# Patient Record
Sex: Male | Born: 1955 | Race: White | Hispanic: No | Marital: Married | Smoking: Never smoker
Health system: Southern US, Community
[De-identification: ages and names within clinical notes are randomized; demographics above are authoritative.]

## PROBLEM LIST (undated history)

## (undated) DIAGNOSIS — I10 Essential (primary) hypertension: Secondary | ICD-10-CM

## (undated) HISTORY — PX: EYE SURGERY: SHX253

## (undated) HISTORY — PX: SHOULDER ARTHROSCOPY: SHX128

## (undated) HISTORY — PX: APPENDECTOMY: SHX54

## (undated) HISTORY — PX: CORONARY ANGIOPLASTY: SHX604

---

## 1998-07-14 ENCOUNTER — Ambulatory Visit (HOSPITAL_BASED_OUTPATIENT_CLINIC_OR_DEPARTMENT_OTHER): Admission: RE | Admit: 1998-07-14 | Discharge: 1998-07-14 | Payer: Self-pay | Admitting: Orthopedic Surgery

## 1999-07-18 ENCOUNTER — Ambulatory Visit (HOSPITAL_COMMUNITY): Admission: RE | Admit: 1999-07-18 | Discharge: 1999-07-18 | Payer: Self-pay | Admitting: *Deleted

## 1999-07-18 ENCOUNTER — Encounter: Payer: Self-pay | Admitting: *Deleted

## 2000-05-31 ENCOUNTER — Emergency Department (HOSPITAL_COMMUNITY): Admission: EM | Admit: 2000-05-31 | Discharge: 2000-05-31 | Payer: Self-pay

## 2000-12-17 ENCOUNTER — Encounter: Payer: Self-pay | Admitting: Emergency Medicine

## 2000-12-17 ENCOUNTER — Emergency Department (HOSPITAL_COMMUNITY): Admission: EM | Admit: 2000-12-17 | Discharge: 2000-12-17 | Payer: Self-pay | Admitting: Emergency Medicine

## 2006-08-19 ENCOUNTER — Emergency Department (HOSPITAL_COMMUNITY): Admission: EM | Admit: 2006-08-19 | Discharge: 2006-08-19 | Payer: Self-pay | Admitting: Emergency Medicine

## 2008-10-01 IMAGING — CR DG LUMBAR SPINE COMPLETE 4+V
5 series · 5 of 5 positions shown · non-contrast
Comparison: None.

CLINICAL DATA: Back and abdominal pain. Rectal bleeding.
 ACUTE ABDOMINAL SERIES - 3 VIEW:

[t l-spine a.p.]
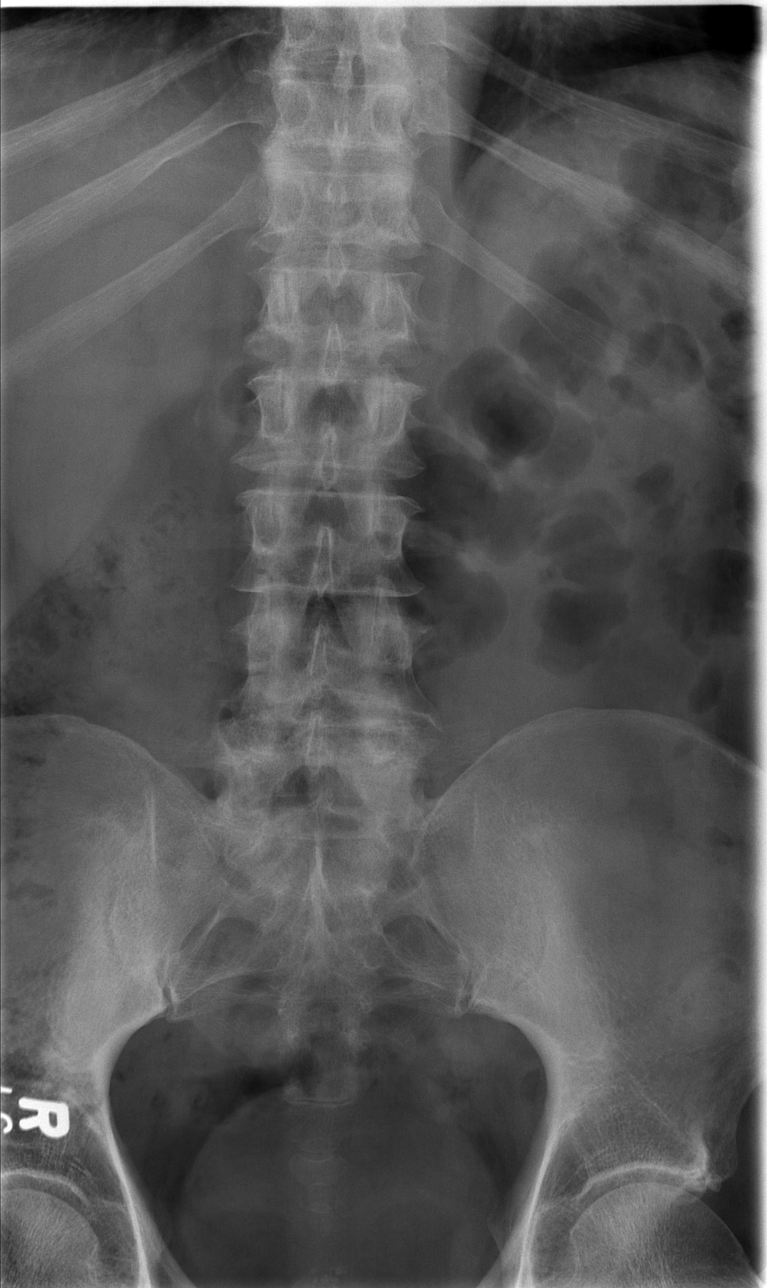

[t l-spine oblique exposure (1 of 2)]
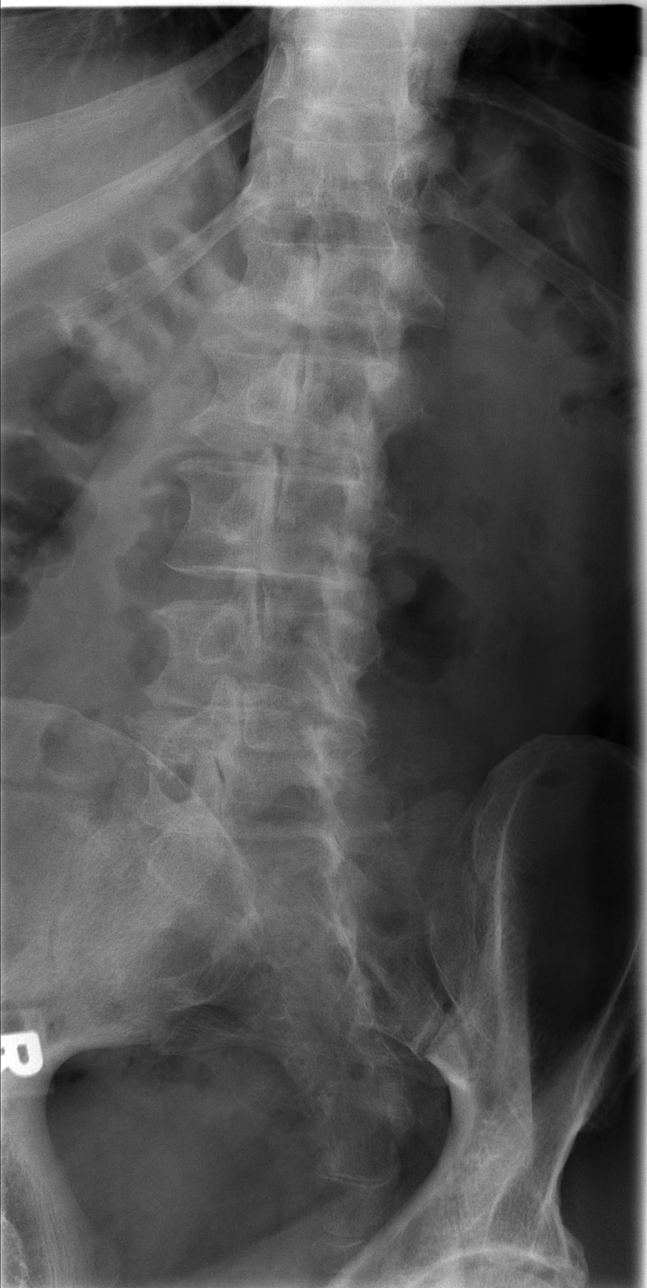

[t l-spine oblique exposure (2 of 2)]
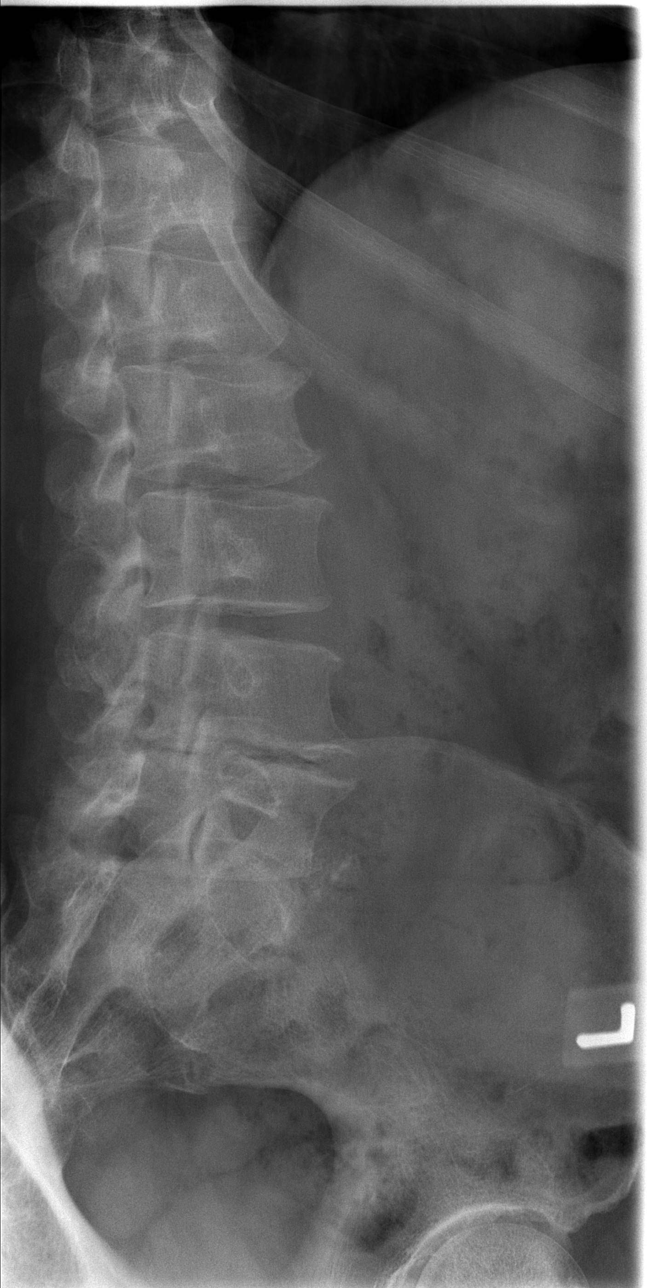

[t l-spine lat]
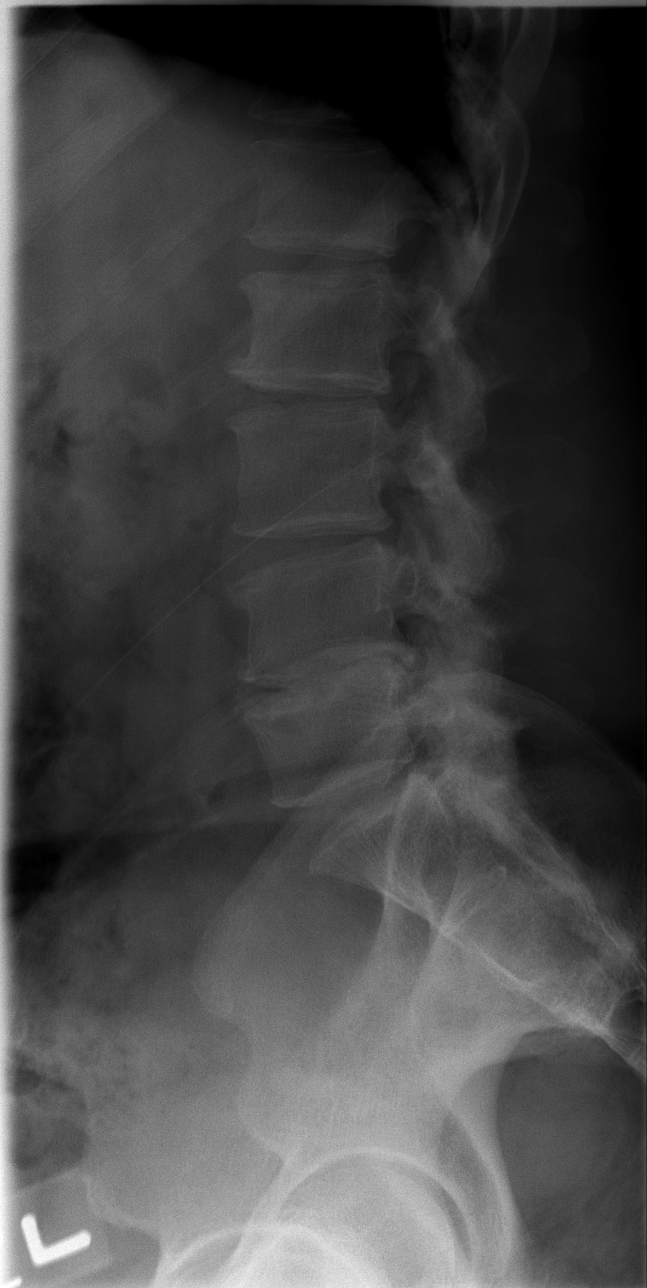

[t l-spine l5-s1 spot]
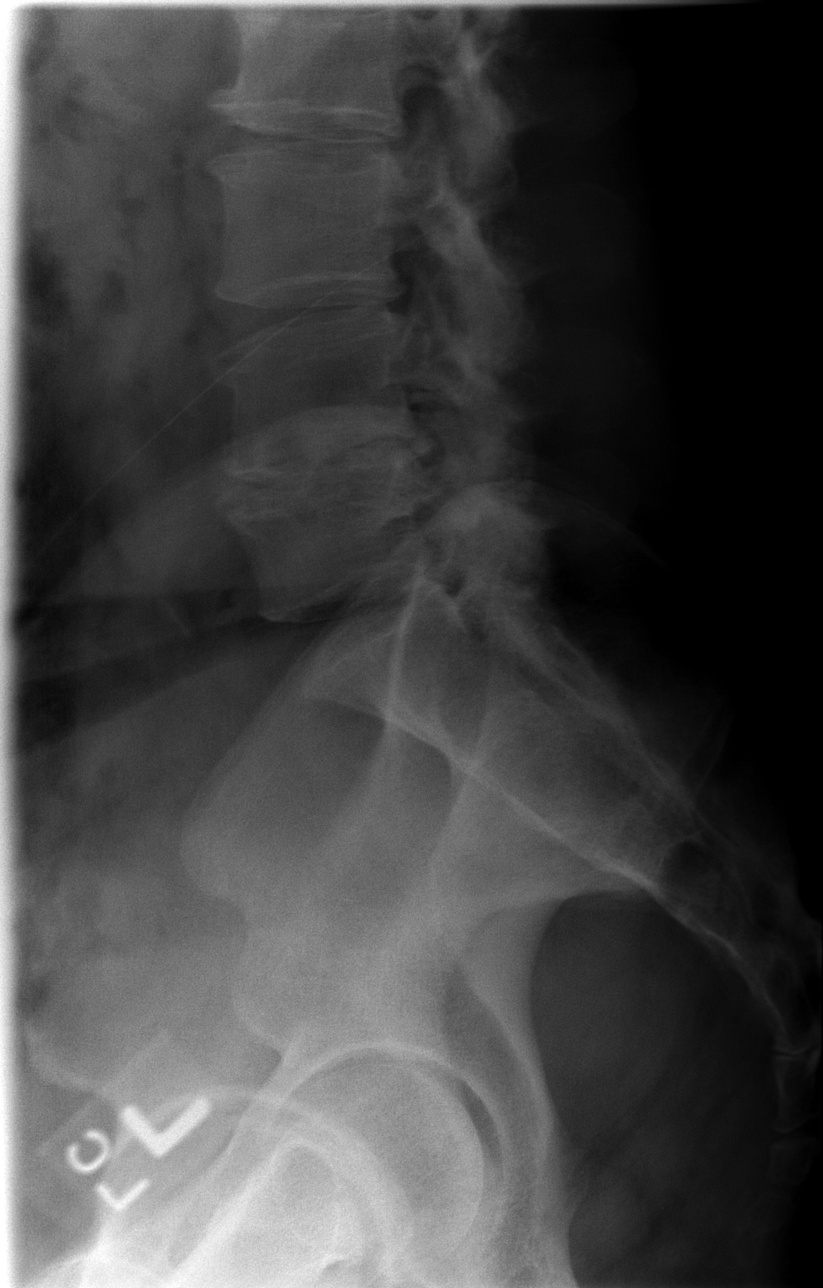

[5 of 5 positions shown; findings below may reference images not displayed]

FINDINGS: Single view of chest demonstrates clear lungs and normal heart size. No effusion or focal bony abnormality. 
 Two views of the abdomen show a normal bowel gas pattern and no unexpected abdominal calcifications. No free intraperitoneal air.
IMPRESSION: No acute finding chest or abdomen. 
 THORACIC SPINE - 2 VIEW:
FINDINGS: Vertebral body height and alignment are maintained. There is some degenerative disease scattered through the spine.
IMPRESSION: No acute finding with relatively mild appearing degenerative disease noted.
 LUMBAR SPINE - 4 VIEW:
FINDINGS: Vertebral body height and alignment are maintained. The patient has multilevel degenerative disc disease with loss of disc space height at all levels of the lumbar spine, worst at L4-5. Extensive facet arthropathy is noted.
IMPRESSION: No acute finding with marked degenerative change of the lumbar spine appearing worst at L4-5.

## 2011-10-18 ENCOUNTER — Emergency Department (HOSPITAL_BASED_OUTPATIENT_CLINIC_OR_DEPARTMENT_OTHER)
Admission: EM | Admit: 2011-10-18 | Discharge: 2011-10-18 | Disposition: A | Discharge status: Home / Self Care | Payer: BC Managed Care – PPO | Attending: Emergency Medicine | Admitting: Emergency Medicine

## 2011-10-18 ENCOUNTER — Encounter (HOSPITAL_BASED_OUTPATIENT_CLINIC_OR_DEPARTMENT_OTHER): Payer: Self-pay | Admitting: *Deleted

## 2011-10-18 DIAGNOSIS — I10 Essential (primary) hypertension: Secondary | ICD-10-CM | POA: Insufficient documentation

## 2011-10-18 DIAGNOSIS — Y92838 Other recreation area as the place of occurrence of the external cause: Secondary | ICD-10-CM | POA: Insufficient documentation

## 2011-10-18 DIAGNOSIS — S61209A Unspecified open wound of unspecified finger without damage to nail, initial encounter: Secondary | ICD-10-CM | POA: Insufficient documentation

## 2011-10-18 DIAGNOSIS — W230XXA Caught, crushed, jammed, or pinched between moving objects, initial encounter: Secondary | ICD-10-CM | POA: Insufficient documentation

## 2011-10-18 DIAGNOSIS — S61219A Laceration without foreign body of unspecified finger without damage to nail, initial encounter: Secondary | ICD-10-CM

## 2011-10-18 DIAGNOSIS — Y9389 Activity, other specified: Secondary | ICD-10-CM | POA: Insufficient documentation

## 2011-10-18 DIAGNOSIS — Y9239 Other specified sports and athletic area as the place of occurrence of the external cause: Secondary | ICD-10-CM | POA: Insufficient documentation

## 2011-10-18 DIAGNOSIS — Y998 Other external cause status: Secondary | ICD-10-CM | POA: Insufficient documentation

## 2011-10-18 HISTORY — DX: Essential (primary) hypertension: I10

## 2011-10-18 MED ORDER — TETANUS-DIPHTH-ACELL PERTUSSIS 5-2.5-18.5 LF-MCG/0.5 IM SUSP
0.5000 mL | Freq: Once | INTRAMUSCULAR | Status: AC
  Administered 2011-10-18: 0.5 mL via INTRAMUSCULAR

## 2011-10-18 NOTE — ED Provider Notes (Signed)
History     CSN: [id number]  Arrival date & time 10/18/11  0041   First MD Initiated Contact with Patient 10/18/11 0050      Chief Complaint  Patient presents with  . Laceration    (Consider location/radiation/quality/duration/timing/severity/associated sxs/prior treatment) HPI Patient is a 56 year old male who was at the gym this evening lifting weights when he sustained a laceration over the PIP joint of his right third finger when placing the weight back and the rack. Patient did not have a crush injury. Patient is able to flex and extend without difficulty. He reports that there was a significant amount of initial bleeding but is no longer actively bleeding here. A 1-1/2 cm laceration is noted with only a half millimeter of gapping incomplete flexion. Patient says his pain level is a two out of three and a slight throbbing. He's not a diabetic and has no history of immunodeficiency. Patient denies any other injuries. He does not know when his last tetanus shot was. There are no other associated or modifying factors. Pain is worse with flexion and better with relaxation. Past Medical History  Diagnosis Date  . Hypertension     Past Surgical History  Procedure Date  . Eye surgery     History reviewed. No pertinent family history.  History  Substance Use Topics  . Smoking status: Never Smoker   . Smokeless tobacco: Not on file  . Alcohol Use: No      Review of Systems  Constitutional: Negative.   Musculoskeletal: Negative.   Skin: Positive for wound.  Neurological: Negative.   All other systems reviewed and are negative.    Allergies  Review of patient's allergies indicates not on file.  Home Medications   Current Outpatient Rx  Name Route Sig Dispense Refill  . LISINOPRIL 10 MG PO TABS Oral Take 10 mg by mouth daily.    Marland Kitchen SIMVASTATIN 40 MG PO TABS Oral Take 40 mg by mouth every evening.      BP 124/77  Pulse 102  Temp 98.2 F (36.8 C) (Oral)  Resp 20   Ht 5' 4.75" (1.645 m)  Wt 230 lb (104.327 kg)  BMI 38.57 kg/m2  SpO2 95%  Physical Exam  Nursing note and vitals reviewed. GEN: Well-developed, well-nourished male in no distress HEENT: Atraumatic, normocephalic.  EYES: PERRLA BL, no scleral icterus. NECK: Trachea midline, no meningismus CV: regular rate and rhythm.  PULM: No respiratory distress.   Neuro: cranial nerves grossly 2-12 intact, no abnormalities of strength or sensation, A and O x 3 MSK: Patient moves all 4 extremities symmetrically, no deformity, edema, or injury noted Skin: Patient was a one and a half centimeter horizontally oriented laceration noted crossing the PIP of the right flexion the patient this only 1/2 mm of gapping with no active bleeding. Psych: no abnormality of mood   ED Course  Procedures (including critical care time) LACERATION REPAIR Performed by: Cyndra Numbers Authorized by: Cyndra Numbers Consent: Verbal consent obtained. Risks and benefits: risks, benefits and alternatives were discussed Consent given by: patient Patient identity confirmed: provided demographic data Prepped and Draped in normal sterile fashion Wound explored  Laceration Location: Right third finger   Laceration Length: 1.5 cm  No Foreign Bodies seen or palpated  Anesthesia: none Irrigation method: soaking with iodine Amount of cleaning: standard  Skin closure: steristrips  Number of sutures: 2 strips   Patient tolerance: Patient tolerated the procedure well with no immediate complications. Labs Reviewed - No data to  display No results found.   1. Laceration of finger of right hand       MDM  Patient was evaluated by myself. Patient was not having any active bleeding at his laceration site. I did not have suspicion for bony injury and patient described injury is having the skin caught rather than having his whole finger crushed between weights. He had full flexion and extension intact. Patient and I discussed  the risks and benefits versus suturing, skin adhesive, and Steri-Strips. He's not having conditions that would predispose him to poor wound healing. 2 Steri-Strips are applied. A tetanus shot was in minister at its patient did not member when his last one occurred. Patient had bandage applied was placed in finger splint to prevent flexion of the joint for the next 48 hours to promote healing. At this point patient can remove this. He was also told that if he finds this too troublesome he is welcome to remove the finger splint as well. Patient was discharged in good condition and can followup with her regular Dr. as needed. I did not anticipate that he should require any followup.        Cyndra Numbers, MD 10/18/11 [phone]

## 2011-10-18 NOTE — ED Notes (Addendum)
Pt was putting a weight up and it slipped pt with laceration to right index finger bandage applied bleeding controlled last tetanus unknown.

## 2015-01-24 ENCOUNTER — Emergency Department (HOSPITAL_BASED_OUTPATIENT_CLINIC_OR_DEPARTMENT_OTHER)
Admission: EM | Admit: 2015-01-24 | Discharge: 2015-01-24 | Disposition: A | Discharge status: Home / Self Care | Payer: BLUE CROSS/BLUE SHIELD | Attending: Emergency Medicine | Admitting: Emergency Medicine

## 2015-01-24 ENCOUNTER — Encounter (HOSPITAL_BASED_OUTPATIENT_CLINIC_OR_DEPARTMENT_OTHER): Payer: Self-pay | Admitting: Emergency Medicine

## 2015-01-24 DIAGNOSIS — Z79899 Other long term (current) drug therapy: Secondary | ICD-10-CM | POA: Insufficient documentation

## 2015-01-24 DIAGNOSIS — B86 Scabies: Secondary | ICD-10-CM | POA: Diagnosis not present

## 2015-01-24 DIAGNOSIS — I10 Essential (primary) hypertension: Secondary | ICD-10-CM | POA: Diagnosis not present

## 2015-01-24 DIAGNOSIS — R21 Rash and other nonspecific skin eruption: Secondary | ICD-10-CM | POA: Diagnosis present

## 2015-01-24 MED ORDER — PERMETHRIN 5 % EX CREA: TOPICAL_CREAM | CUTANEOUS | Status: AC

## 2015-01-24 NOTE — ED Notes (Signed)
MD at bedside. 

## 2015-01-24 NOTE — Discharge Instructions (Signed)

## 2015-01-24 NOTE — ED Provider Notes (Signed)
CSN: [id number]     Arrival date & time 01/24/15  1958 History  This chart was scribed for April Palumbo, PA-C, by Elon Spanner, ED Scribe. This patient was seen in room Rm 9and the patient's care was started at 11:15 AM.   Chief Complaint  Patient presents with  . Rash   Patient is a 59 y.o. male presenting with rash. The history is provided by the patient. No language interpreter was used.  Rash Location:  Torso, shoulder/arm and leg Shoulder/arm rash location:  L arm and R arm Torso rash location:  Abd RLQ, abd RUQ and abd LUQ Leg rash location:  L upper leg and R upper leg Quality: itchiness   Severity:  Moderate Onset quality:  Gradual Timing:  Constant Progression:  Unchanged Chronicity:  New Context: not hot tub use   Relieved by:  Nothing Worsened by:  Nothing tried Ineffective treatments:  None tried Associated symptoms: no abdominal pain, no throat swelling and no tongue swelling    HPI Comments: KAICEN DESENA is a 59 y.o. male who presents to the Emergency Department complaining of a worsening, constant itching rash onset 8 weeks ago.  He denies known exposures to others with similar complaint.  The patient was seen by his PCP at onset and dx'd with rosacea.  He was later seen by his dermatologist who told him they were unsure what the complaint was.   Past Medical History  Diagnosis Date  . Hypertension    Past Surgical History  Procedure Laterality Date  . Eye surgery    . Shoulder arthroscopy Bilateral   . Appendectomy    . Coronary angioplasty     History reviewed. No pertinent family history. Social History  Substance Use Topics  . Smoking status: Never Smoker   . Smokeless tobacco: None  . Alcohol Use: No    Review of Systems  Gastrointestinal: Negative for abdominal pain.  Skin: Positive for rash.  All other systems reviewed and are negative.     Allergies  Lisinopril  Home Medications   Prior to Admission medications   Medication Sig  Start Date End Date Taking? Authorizing Harrietta Incorvaia  LOSARTAN POTASSIUM PO Take by mouth.   Yes Historical Tashena Ibach, MD  lisinopril (PRINIVIL,ZESTRIL) 10 MG tablet Take 10 mg by mouth daily.    Historical Yeraldin Litzenberger, MD  simvastatin (ZOCOR) 40 MG tablet Take 40 mg by mouth every evening.    Historical Thaxton Pelley, MD   BP 136/77 mmHg  Pulse 73  Temp(Src) 98.1 F (36.7 C) (Oral)  Resp 18  Ht  (1.626 m)  Wt 210 lb (95.255 kg)  BMI 36.03 kg/m2  SpO2 100% Physical Exam  Constitutional: He is oriented to person, place, and time. He appears well-developed and well-nourished. No distress.  HENT:  Head: Normocephalic and atraumatic.  Mouth/Throat: Oropharynx is clear and moist. No oropharyngeal exudate.  Eyes: Conjunctivae and EOM are normal. Pupils are equal, round, and reactive to light.  Neck: Normal range of motion. Neck supple. No tracheal deviation present.  Cardiovascular: Normal rate and regular rhythm.   RRR.  Pulmonary/Chest: Effort normal. No respiratory distress. He has no wheezes. He has no rales.  Lungs CTA.  Abdominal: Soft. Bowel sounds are normal. There is no tenderness. There is no rebound and no guarding.  Musculoskeletal: Normal range of motion.  Neurological: He is alert and oriented to person, place, and time. He has normal reflexes.  Skin: Skin is warm and dry.  None in the web  spaces or palms or volar surface of the wrists.  No burrowing.  Burrows on the volar surface of the arm near the antecubitals on both arms.  Some on chest.  Patient presents bugs on a sheet of paper that look like scabies.   Psychiatric: He has a normal mood and affect. His behavior is normal.  Nursing note and vitals reviewed.   ED Course  Procedures (including critical care time)  DIAGNOSTIC STUDIES: Oxygen Saturation is 100% on RA, normal by my interpretation.    COORDINATION OF CARE:  11:20 AM Discussed treatment plan with patient at bedside.  Patient acknowledges and agrees with plan.     Labs Review Labs Reviewed - No data to display  Imaging Review No results found. I have personally reviewed and evaluated these images and lab results as part of my medical decision-making.   EKG Interpretation None      MDM   Final diagnoses:  None    Presented EDP with insects that are consistent with scabies.  Will treat with permethrin.  Follow up with your PMD for ongoing care.    I personally performed the services described in this documentation, which was scribed in my presence. The recorded information has been reviewed and is accurate.     Cy Blamer, MD 01/25/15 [phone]

## 2015-01-24 NOTE — ED Notes (Signed)
Pt states he has had this rash for 6-8 weeks, been seen for it and no one can tell what it is.

## 2015-01-25 ENCOUNTER — Encounter (HOSPITAL_BASED_OUTPATIENT_CLINIC_OR_DEPARTMENT_OTHER): Payer: Self-pay | Admitting: Emergency Medicine

## 2015-02-13 NOTE — ED Notes (Signed)
Patient called to state that he continues to have a rash and itching.  States he has an appointment with his Dermatologist in four days for followup and was told to contact [facility] for additional medications to treat the scabies, that they would not be able to call in medications without seeing him.  Reviewed chart with Dr. Jethro Bolus.  Feels that the patient needs to be reassessed before additional medications can be prescribed. Encouraged to use Benadryl for itching.  Patient verbalized understanding.

## 2019-03-11 ENCOUNTER — Other Ambulatory Visit: Payer: Self-pay

## 2019-03-11 DIAGNOSIS — Z20822 Contact with and (suspected) exposure to covid-19: Secondary | ICD-10-CM

## 2019-03-12 LAB — NOVEL CORONAVIRUS, NAA: SARS-CoV-2, NAA: DETECTED — AB
